# Patient Record
Sex: Female | Born: 1965 | Hispanic: No | Marital: Single | Smoking: Never smoker
Health system: Southern US, Community
[De-identification: ages and names within clinical notes are randomized; demographics above are authoritative.]

## PROBLEM LIST (undated history)

## (undated) DIAGNOSIS — F329 Major depressive disorder, single episode, unspecified: Secondary | ICD-10-CM

## (undated) DIAGNOSIS — M549 Dorsalgia, unspecified: Secondary | ICD-10-CM

## (undated) DIAGNOSIS — Z973 Presence of spectacles and contact lenses: Secondary | ICD-10-CM

## (undated) DIAGNOSIS — J45909 Unspecified asthma, uncomplicated: Secondary | ICD-10-CM

## (undated) DIAGNOSIS — Z9889 Other specified postprocedural states: Secondary | ICD-10-CM

## (undated) DIAGNOSIS — T4145XA Adverse effect of unspecified anesthetic, initial encounter: Secondary | ICD-10-CM

## (undated) DIAGNOSIS — F32A Depression, unspecified: Secondary | ICD-10-CM

## (undated) DIAGNOSIS — Z8614 Personal history of Methicillin resistant Staphylococcus aureus infection: Secondary | ICD-10-CM

## (undated) DIAGNOSIS — T8859XA Other complications of anesthesia, initial encounter: Secondary | ICD-10-CM

## (undated) DIAGNOSIS — F419 Anxiety disorder, unspecified: Secondary | ICD-10-CM

## (undated) DIAGNOSIS — R51 Headache: Secondary | ICD-10-CM

## (undated) DIAGNOSIS — E7800 Pure hypercholesterolemia, unspecified: Secondary | ICD-10-CM

## (undated) DIAGNOSIS — Z8489 Family history of other specified conditions: Secondary | ICD-10-CM

## (undated) DIAGNOSIS — J302 Other seasonal allergic rhinitis: Secondary | ICD-10-CM

## (undated) DIAGNOSIS — R519 Headache, unspecified: Secondary | ICD-10-CM

## (undated) DIAGNOSIS — Z8709 Personal history of other diseases of the respiratory system: Secondary | ICD-10-CM

## (undated) DIAGNOSIS — R112 Nausea with vomiting, unspecified: Secondary | ICD-10-CM

## (undated) DIAGNOSIS — N393 Stress incontinence (female) (male): Secondary | ICD-10-CM

## (undated) HISTORY — PX: OOPHORECTOMY: SHX86

## (undated) HISTORY — PX: TUBAL LIGATION: SHX77

## (undated) HISTORY — PX: COLONOSCOPY: SHX174

## (undated) HISTORY — PX: ESOPHAGOGASTRODUODENOSCOPY: SHX1529

## (undated) HISTORY — PX: BREAST SURGERY: SHX581

---

## 1990-09-26 HISTORY — PX: HERNIA REPAIR: SHX51

## 1991-09-27 HISTORY — PX: DIAGNOSTIC LAPAROSCOPY: SUR761

## 1999-09-27 HISTORY — PX: ABDOMINAL HYSTERECTOMY: SHX81

## 2003-09-27 HISTORY — PX: CHOLECYSTECTOMY: SHX55

## 2013-08-08 ENCOUNTER — Encounter (HOSPITAL_BASED_OUTPATIENT_CLINIC_OR_DEPARTMENT_OTHER): Payer: Self-pay | Admitting: *Deleted

## 2013-08-08 NOTE — Progress Notes (Signed)
No labs needed

## 2013-08-12 ENCOUNTER — Encounter (HOSPITAL_BASED_OUTPATIENT_CLINIC_OR_DEPARTMENT_OTHER): Payer: Self-pay | Admitting: Certified Registered Nurse Anesthetist

## 2013-08-13 ENCOUNTER — Ambulatory Visit (HOSPITAL_BASED_OUTPATIENT_CLINIC_OR_DEPARTMENT_OTHER)
Admission: RE | Admit: 2013-08-13 | Payer: Managed Care, Other (non HMO) | Source: Ambulatory Visit | Admitting: Plastic Surgery

## 2013-08-13 HISTORY — DX: Unspecified asthma, uncomplicated: J45.909

## 2013-08-13 HISTORY — DX: Other seasonal allergic rhinitis: J30.2

## 2013-08-13 HISTORY — DX: Depression, unspecified: F32.A

## 2013-08-13 HISTORY — DX: Presence of spectacles and contact lenses: Z97.3

## 2013-08-13 HISTORY — DX: Major depressive disorder, single episode, unspecified: F32.9

## 2013-08-13 HISTORY — DX: Other complications of anesthesia, initial encounter: T88.59XA

## 2013-08-13 HISTORY — DX: Dorsalgia, unspecified: M54.9

## 2013-08-13 HISTORY — DX: Adverse effect of unspecified anesthetic, initial encounter: T41.45XA

## 2013-08-13 SURGERY — MAMMOPLASTY, REDUCTION
Anesthesia: General | Site: Breast | Laterality: Bilateral

## 2013-08-13 MED ORDER — FENTANYL CITRATE 0.05 MG/ML IJ SOLN
INTRAMUSCULAR | Status: AC
  Filled 2013-08-13: qty 12

## 2013-08-13 MED ORDER — PROPOFOL 10 MG/ML IV EMUL
INTRAVENOUS | Status: AC
  Filled 2013-08-13: qty 50

## 2013-08-13 MED ORDER — MIDAZOLAM HCL 2 MG/2ML IJ SOLN
INTRAMUSCULAR | Status: AC
  Filled 2013-08-13: qty 2

## 2015-04-20 ENCOUNTER — Encounter (HOSPITAL_BASED_OUTPATIENT_CLINIC_OR_DEPARTMENT_OTHER): Payer: Self-pay | Admitting: Emergency Medicine

## 2015-04-20 DIAGNOSIS — J45909 Unspecified asthma, uncomplicated: Secondary | ICD-10-CM | POA: Diagnosis not present

## 2015-04-20 DIAGNOSIS — R112 Nausea with vomiting, unspecified: Secondary | ICD-10-CM | POA: Insufficient documentation

## 2015-04-20 NOTE — ED Notes (Signed)
Pt states that she has vomited for past week, was placed on antiobitc a week ago, saw pcp today to have abscess lanced, stated she informed pcp about nausea and vomiting, but was told to take medication with food, which she states has not helped, vomited twice tonight.

## 2015-04-21 ENCOUNTER — Emergency Department (HOSPITAL_BASED_OUTPATIENT_CLINIC_OR_DEPARTMENT_OTHER)
Admission: EM | Admit: 2015-04-21 | Discharge: 2015-04-21 | Discharge status: Against Medical Advice | Attending: Emergency Medicine | Admitting: Emergency Medicine

## 2015-08-17 ENCOUNTER — Other Ambulatory Visit: Payer: Self-pay | Admitting: Orthopedic Surgery

## 2015-08-17 DIAGNOSIS — M533 Sacrococcygeal disorders, not elsewhere classified: Secondary | ICD-10-CM

## 2015-09-02 ENCOUNTER — Ambulatory Visit
Admission: RE | Admit: 2015-09-02 | Discharge: 2015-09-02 | Disposition: A | Discharge status: Home / Self Care | Source: Ambulatory Visit | Attending: Orthopedic Surgery | Admitting: Orthopedic Surgery

## 2015-09-02 DIAGNOSIS — M533 Sacrococcygeal disorders, not elsewhere classified: Secondary | ICD-10-CM

## 2015-12-22 NOTE — H&P (Signed)
PREOPERATIVE H&P  Chief Complaint: R low back pain  HPI: Gabrielle Thompson is a 50 y.o. female who presents with ongoing pain in the R low back  Patient did obtain temporary relief from a R SI injection and medial brach blocks, but an RFA was denied and patient continued to have pain  Patient has failed multiple forms of conservative care and continues to have pain (see office notes for additional details regarding the patient's full course of treatment)  Past Medical History  Diagnosis Date  . Asthma   . Seasonal allergies   . Back pain   . Depression   . Wears glasses   . Complication of anesthesia     sensitive to meds-does not take much   Past Surgical History  Procedure Laterality Date  . Cholecystectomy  2005    lap choli  . Abdominal hysterectomy  2001    part-has ovaries  . Diagnostic laparoscopy  1993    lysis adhesions  . Hernia repair  1992    rt ing hernia  . Tubal ligation     Social History   Social History  . Marital Status: Single    Spouse Name: N/A  . Number of Children: N/A  . Years of Education: N/A   Social History Main Topics  . Smoking status: Never Smoker   . Smokeless tobacco: Not on file  . Alcohol Use: Yes     Comment: occ  . Drug Use: No  . Sexual Activity: Not on file   Other Topics Concern  . Not on file   Social History Narrative   No family history on file. Allergies  Allergen Reactions  . Latex Anaphylaxis  . Adhesive [Tape] Other (See Comments)    Blisters skin, Please use "paper" tape  . Clindamycin/Lincomycin Nausea Only    Got c-diff  . Codeine Nausea And Vomiting  . Erythromycin Nausea Only    Gi upset  . Penicillins Nausea And Vomiting    Has patient had a PCN reaction causing immediate rash, facial/tongue/throat swelling, SOB or lightheadedness with hypotension: Yes, severe projectile Has patient had a PCN reaction causing severe rash involving mucus membranes or skin necrosis: No Has patient had a PCN  reaction that required hospitalization No Has patient had a PCN reaction occurring within the last 10 years: No If all of the above answers are "NO", then may proceed with Cephalosporin use.    Prior to Admission medications   Medication Sig Start Date End Date Taking? Authorizing Provider  acetaminophen (TYLENOL) 500 MG tablet Take 1,000 mg by mouth 2 (two) times daily as needed.   Yes Historical Provider, MD  albuterol (PROVENTIL HFA;VENTOLIN HFA) 108 (90 BASE) MCG/ACT inhaler Inhale into the lungs every 6 (six) hours as needed for wheezing or shortness of breath.   Yes Historical Provider, MD  albuterol-ipratropium (COMBIVENT) 18-103 MCG/ACT inhaler Inhale into the lungs every 4 (four) hours as needed for wheezing or shortness of breath.   Yes Historical Provider, MD  BELSOMRA 10 MG TABS Take 10 mg by mouth at bedtime as needed. 11/16/15  Yes Historical Provider, MD  cholecalciferol (VITAMIN D) 1000 UNITS tablet Take 4,000 Units by mouth every evening. 2000   Yes Historical Provider, MD  CRESTOR 5 MG tablet Take 5 mg by mouth at bedtime. 09/18/15  Yes Historical Provider, MD  diphenhydrAMINE (BENADRYL) 25 mg capsule Take 25 mg by mouth at bedtime as needed.   Yes Historical Provider, MD  Fluticasone-Salmeterol (ADVAIR  DISKUS) 250-50 MCG/DOSE AEPB Inhale 1 puff into the lungs 2 (two) times daily as needed. 12/01/14  Yes Historical Provider, MD  loratadine (CLARITIN) 10 MG tablet Take 10 mg by mouth every evening.   Yes Historical Provider, MD  LYRICA 50 MG capsule Take 50 mg by mouth 3 (three) times daily as needed. 11/16/15  Yes Historical Provider, MD  ondansetron (ZOFRAN) 4 MG tablet Take 4 mg by mouth every 8 (eight) hours as needed for nausea or vomiting.   Yes Historical Provider, MD  RA KRILL OIL 500 MG CAPS Take 1,000 mg by mouth daily.   Yes Historical Provider, MD  sertraline (ZOLOFT) 50 MG tablet Take 50 mg by mouth every morning. Reported on 12/21/2015   Yes Historical Provider, MD    traMADol (ULTRAM) 50 MG tablet Take 100 mg by mouth 3 (three) times daily as needed for moderate pain.  12/10/15  Yes Historical Provider, MD     All other systems have been reviewed and were otherwise negative with the exception of those mentioned in the HPI and as above.  Physical Exam: There were no vitals filed for this visit.  General: Alert, no acute distress Cardiovascular: No pedal edema Respiratory: No cyanosis, no use of accessory musculature Skin: No lesions in the area of chief complaint Neurologic: Sensation intact distally Psychiatric: Patient is competent for consent with normal mood and affect Lymphatic: No axillary or cervical lymphadenopathy  MUSCULOSKELETAL: + TTP R low back  Assessment/Plan: Right sided sacroiliac joint dysfunction  Plan for Procedure(s): RIGHT SIDED SACROILIAC JOINT FUSION   Emilee HeroUMONSKI,Brieanna Nau LEONARD, MD 12/22/2015 12:44 PM

## 2015-12-23 ENCOUNTER — Ambulatory Visit (HOSPITAL_COMMUNITY)
Admission: RE | Admit: 2015-12-23 | Discharge: 2015-12-23 | Disposition: A | Discharge status: Home / Self Care | Source: Ambulatory Visit | Attending: Orthopedic Surgery | Admitting: Orthopedic Surgery

## 2015-12-23 ENCOUNTER — Encounter (HOSPITAL_COMMUNITY)
Admission: RE | Admit: 2015-12-23 | Discharge: 2015-12-23 | Disposition: A | Discharge status: Home / Self Care | Source: Ambulatory Visit | Attending: Orthopedic Surgery | Admitting: Orthopedic Surgery

## 2015-12-23 ENCOUNTER — Encounter (HOSPITAL_COMMUNITY): Payer: Self-pay

## 2015-12-23 DIAGNOSIS — Z01818 Encounter for other preprocedural examination: Secondary | ICD-10-CM | POA: Diagnosis not present

## 2015-12-23 DIAGNOSIS — R9431 Abnormal electrocardiogram [ECG] [EKG]: Secondary | ICD-10-CM | POA: Insufficient documentation

## 2015-12-23 DIAGNOSIS — I517 Cardiomegaly: Secondary | ICD-10-CM | POA: Insufficient documentation

## 2015-12-23 DIAGNOSIS — Z01812 Encounter for preprocedural laboratory examination: Secondary | ICD-10-CM | POA: Insufficient documentation

## 2015-12-23 DIAGNOSIS — E7800 Pure hypercholesterolemia, unspecified: Secondary | ICD-10-CM | POA: Insufficient documentation

## 2015-12-23 DIAGNOSIS — Z01810 Encounter for preprocedural cardiovascular examination: Secondary | ICD-10-CM | POA: Insufficient documentation

## 2015-12-23 HISTORY — DX: Stress incontinence (female) (male): N39.3

## 2015-12-23 HISTORY — DX: Pure hypercholesterolemia, unspecified: E78.00

## 2015-12-23 HISTORY — DX: Other specified postprocedural states: R11.2

## 2015-12-23 HISTORY — DX: Family history of other specified conditions: Z84.89

## 2015-12-23 HISTORY — DX: Personal history of other diseases of the respiratory system: Z87.09

## 2015-12-23 HISTORY — DX: Other specified postprocedural states: Z98.890

## 2015-12-23 HISTORY — DX: Personal history of Methicillin resistant Staphylococcus aureus infection: Z86.14

## 2015-12-23 HISTORY — DX: Headache: R51

## 2015-12-23 HISTORY — DX: Headache, unspecified: R51.9

## 2015-12-23 HISTORY — DX: Anxiety disorder, unspecified: F41.9

## 2015-12-23 LAB — PROTIME-INR
INR: 0.96 (ref 0.00–1.49)
PROTHROMBIN TIME: 13 s (ref 11.6–15.2)

## 2015-12-23 LAB — COMPREHENSIVE METABOLIC PANEL
ALT: 56 U/L — AB (ref 14–54)
AST: 47 U/L — AB (ref 15–41)
Albumin: 4.4 g/dL (ref 3.5–5.0)
Alkaline Phosphatase: 78 U/L (ref 38–126)
Anion gap: 9 (ref 5–15)
BILIRUBIN TOTAL: 0.6 mg/dL (ref 0.3–1.2)
BUN: 11 mg/dL (ref 6–20)
CO2: 26 mmol/L (ref 22–32)
CREATININE: 1.07 mg/dL — AB (ref 0.44–1.00)
Calcium: 9.7 mg/dL (ref 8.9–10.3)
Chloride: 107 mmol/L (ref 101–111)
GFR calc Af Amer: >60 mL/min (ref >60)
Glucose, Bld: 97 mg/dL (ref 65–99)
Potassium: 4.6 mmol/L (ref 3.5–5.1)
Sodium: 142 mmol/L (ref 135–145)
TOTAL PROTEIN: 7.8 g/dL (ref 6.5–8.1)

## 2015-12-23 LAB — URINE MICROSCOPIC-ADD ON

## 2015-12-23 LAB — URINALYSIS, ROUTINE W REFLEX MICROSCOPIC
BILIRUBIN URINE: NEGATIVE
GLUCOSE, UA: NEGATIVE mg/dL
KETONES UR: NEGATIVE mg/dL
Leukocytes, UA: NEGATIVE
Nitrite: NEGATIVE
PH: 6 (ref 5.0–8.0)
Protein, ur: NEGATIVE mg/dL
Specific Gravity, Urine: 1.021 (ref 1.005–1.030)

## 2015-12-23 LAB — CBC WITH DIFFERENTIAL/PLATELET
BASOS ABS: 0 10*3/uL (ref 0.0–0.1)
Basophils Relative: 0 %
Eosinophils Absolute: 0.1 10*3/uL (ref 0.0–0.7)
Eosinophils Relative: 2 %
HEMATOCRIT: 43.9 % (ref 36.0–46.0)
Hemoglobin: 14.7 g/dL (ref 12.0–15.0)
LYMPHS ABS: 1.2 10*3/uL (ref 0.7–4.0)
LYMPHS PCT: 22 %
MCH: 30.2 pg (ref 26.0–34.0)
MCHC: 33.5 g/dL (ref 30.0–36.0)
MCV: 90.1 fL (ref 78.0–100.0)
MONO ABS: 0.5 10*3/uL (ref 0.1–1.0)
Monocytes Relative: 9 %
NEUTROS ABS: 3.9 10*3/uL (ref 1.7–7.7)
Neutrophils Relative %: 67 %
Platelets: 272 10*3/uL (ref 150–400)
RBC: 4.87 MIL/uL (ref 3.87–5.11)
RDW: 12.8 % (ref 11.5–15.5)
WBC: 5.8 10*3/uL (ref 4.0–10.5)

## 2015-12-23 LAB — SURGICAL PCR SCREEN
MRSA, PCR: NEGATIVE
Staphylococcus aureus: NEGATIVE

## 2015-12-23 LAB — TYPE AND SCREEN
ABO/RH(D): A POS
Antibody Screen: NEGATIVE

## 2015-12-23 LAB — ABO/RH: ABO/RH(D): A POS

## 2015-12-23 LAB — APTT: APTT: 27 s (ref 24–37)

## 2015-12-23 MED ORDER — VANCOMYCIN HCL IN DEXTROSE 1-5 GM/200ML-% IV SOLN
1000.0000 mg | INTRAVENOUS | Status: AC
  Administered 2015-12-24: 1000 mg via INTRAVENOUS
  Filled 2015-12-23: qty 200

## 2015-12-23 NOTE — Progress Notes (Signed)
PCP - Dr. Everrett Coombeody Matthews Cardiologist - denies  EKG - 12/23/15 CXR - 12/23/15  Echo/stress test/cardiac cath - denies  Patient denies chest pain and shortness of breath at PAT appointment.

## 2015-12-23 NOTE — Pre-Procedure Instructions (Signed)
    Nichele Kohlmeyer  12/23/2015      [facility] [phone] - [place], [place] - [address] [facility] [address] [place] [id number] Phone: [phone] Fax: [phone]    Your procedure is scheduled on Thursday, March 30th, 2017.  [facility] 7:00 [facility].M.  Call this number if you have problems the morning of surgery:  [phone]   Remember:  Do not eat food or drink liquids after midnight.   Take these medicines the morning of surgery with A SIP OF WATER: Acetaminophen (Tylenol) if needed, Albuterol inhaler if needed (please bring with you), Albuterol-ipratropium (Combivent) inhaler if needed, Advair Diskus if needed, Lyrica if needed, Ondansetron (Zofran) if needed, Sertraline (Zoloft), Tramadol (Ultram) if needed.   Stop taking: Aspirin, NSAIDS, Aleve, Naproxen, Ibuprofen, Advil, Motrin, BC's, Goody's, Fish oil, all herbal medications, and all vitamins.    Do not wear jewelry, make-up or nail polish.  Do not wear lotions, powders, or perfumes.  You may NOT wear deodorant.  Do not shave 48 hours prior to surgery.   Do not bring valuables to the hospital.   [facility] is not responsible for any belongings or valuables.  Contacts, dentures or bridgework may not be worn into surgery.  Leave your suitcase in the car.  After surgery it may be brought to your room.  For patients admitted to the hospital, discharge time will be determined by your treatment team.  Patients discharged the day of surgery will not be allowed to drive home.   Special instructions:  See attached.   Please read over the following fact sheets that you were given. Pain Booklet, Coughing and Deep Breathing, Blood Transfusion Information, MRSA Information and Surgical Site Infection Prevention

## 2015-12-23 NOTE — Progress Notes (Signed)
Anesthesia Chart Review: Patient is a 50 year old female scheduled for right sided SI joint fusion on 12/24/15 by Dr. Yevette Edwardsumonski.  History includes non-smoker, post-operative N/V, asthma, back pain, depression, hypercholesterolemia, anxiety, migraines, MRSA, cholecystectomy, hysterectomy, right inguinal hernia repair. BMI 29. PCP is Dr. Everrett Coombeody Matthews.  Meds include albuterol, Belsomra, Combivent, Crestor, Benadryl, Advair, Claritin, Lyrica, Zofran, Krill oil, Zoloft, tramadol.    12/23/15 EKG: NSR, possible anterior infarct (age undetermined). No comparison tracing seen in Epic, Care Everywhere, or Muse. She denied CP, SOB, and prior cardiac testing at PAT.  If no acute changes then I anticipate that she can proceed as planned.  Velna Ochsllison Kiearra Oyervides, PA-C [facility]/Anesthesiology Phone [phone] 12/23/2015 2:21 PM

## 2015-12-24 ENCOUNTER — Encounter (HOSPITAL_COMMUNITY)
Admission: RE | Disposition: A | Discharge status: Home / Self Care | Payer: Self-pay | Source: Ambulatory Visit | Attending: Orthopedic Surgery

## 2015-12-24 ENCOUNTER — Ambulatory Visit (HOSPITAL_COMMUNITY)

## 2015-12-24 ENCOUNTER — Encounter (HOSPITAL_COMMUNITY): Payer: Self-pay | Admitting: *Deleted

## 2015-12-24 ENCOUNTER — Ambulatory Visit (HOSPITAL_COMMUNITY)
Admission: RE | Admit: 2015-12-24 | Discharge: 2015-12-24 | Disposition: A | Discharge status: Home / Self Care | Source: Ambulatory Visit | Attending: Orthopedic Surgery | Admitting: Orthopedic Surgery

## 2015-12-24 ENCOUNTER — Ambulatory Visit (HOSPITAL_COMMUNITY): Admitting: Anesthesiology

## 2015-12-24 DIAGNOSIS — M533 Sacrococcygeal disorders, not elsewhere classified: Secondary | ICD-10-CM | POA: Diagnosis not present

## 2015-12-24 DIAGNOSIS — Z419 Encounter for procedure for purposes other than remedying health state, unspecified: Secondary | ICD-10-CM

## 2015-12-24 DIAGNOSIS — J45909 Unspecified asthma, uncomplicated: Secondary | ICD-10-CM | POA: Insufficient documentation

## 2015-12-24 HISTORY — PX: SACROILIAC JOINT FUSION: SHX6088

## 2015-12-24 SURGERY — SACROILIAC JOINT FUSION
Anesthesia: General | Laterality: Right

## 2015-12-24 MED ORDER — SCOPOLAMINE 1 MG/3DAYS TD PT72
MEDICATED_PATCH | TRANSDERMAL | Status: AC
  Filled 2015-12-24: qty 1

## 2015-12-24 MED ORDER — 0.9 % SODIUM CHLORIDE (POUR BTL) OPTIME
TOPICAL | Status: DC | PRN
  Administered 2015-12-24: 1000 mL

## 2015-12-24 MED ORDER — OXYCODONE HCL 5 MG PO TABS
5.0000 mg | ORAL_TABLET | Freq: Once | ORAL | Status: AC | PRN
  Administered 2015-12-24: 5 mg via ORAL

## 2015-12-24 MED ORDER — OXYCODONE HCL 5 MG PO TABS
ORAL_TABLET | ORAL | Status: AC
  Filled 2015-12-24: qty 1

## 2015-12-24 MED ORDER — MIDAZOLAM HCL 2 MG/2ML IJ SOLN
INTRAMUSCULAR | Status: AC
  Filled 2015-12-24: qty 2

## 2015-12-24 MED ORDER — HYDROMORPHONE HCL 1 MG/ML IJ SOLN
INTRAMUSCULAR | Status: AC
  Administered 2015-12-24: 0.5 mg via INTRAVENOUS
  Filled 2015-12-24: qty 1

## 2015-12-24 MED ORDER — GLYCOPYRROLATE 0.2 MG/ML IJ SOLN
INTRAMUSCULAR | Status: DC | PRN
  Administered 2015-12-24: 0.4 mg via INTRAVENOUS

## 2015-12-24 MED ORDER — BUPIVACAINE-EPINEPHRINE 0.25% -1:200000 IJ SOLN
INTRAMUSCULAR | Status: DC | PRN
  Administered 2015-12-24: 30 mL

## 2015-12-24 MED ORDER — ROCURONIUM BROMIDE 50 MG/5ML IV SOLN
INTRAVENOUS | Status: AC
  Filled 2015-12-24: qty 1

## 2015-12-24 MED ORDER — PROPOFOL 10 MG/ML IV BOLUS
INTRAVENOUS | Status: AC
  Filled 2015-12-24: qty 20

## 2015-12-24 MED ORDER — FENTANYL CITRATE (PF) 100 MCG/2ML IJ SOLN
INTRAMUSCULAR | Status: DC | PRN
  Administered 2015-12-24: 50 ug via INTRAVENOUS
  Administered 2015-12-24: 150 ug via INTRAVENOUS
  Administered 2015-12-24: 50 ug via INTRAVENOUS

## 2015-12-24 MED ORDER — MIDAZOLAM HCL 5 MG/5ML IJ SOLN
INTRAMUSCULAR | Status: DC | PRN
  Administered 2015-12-24: 2 mg via INTRAVENOUS

## 2015-12-24 MED ORDER — ONDANSETRON HCL 4 MG/2ML IJ SOLN
INTRAMUSCULAR | Status: AC
  Filled 2015-12-24: qty 2

## 2015-12-24 MED ORDER — HYDROMORPHONE HCL 1 MG/ML IJ SOLN
0.2500 mg | INTRAMUSCULAR | Status: DC | PRN
  Administered 2015-12-24 (×4): 0.5 mg via INTRAVENOUS

## 2015-12-24 MED ORDER — CYCLOBENZAPRINE HCL 5 MG PO TABS: 5.0000 mg | ORAL_TABLET | Freq: Three times a day (TID) | ORAL | Status: AC | PRN

## 2015-12-24 MED ORDER — OXYCODONE HCL 5 MG/5ML PO SOLN: 5.0000 mg | Freq: Once | ORAL | Status: AC | PRN

## 2015-12-24 MED ORDER — PROPOFOL 10 MG/ML IV BOLUS
INTRAVENOUS | Status: DC | PRN
  Administered 2015-12-24: 150 mg via INTRAVENOUS

## 2015-12-24 MED ORDER — NEOSTIGMINE METHYLSULFATE 10 MG/10ML IV SOLN
INTRAVENOUS | Status: DC | PRN
  Administered 2015-12-24: 2 mg via INTRAVENOUS

## 2015-12-24 MED ORDER — LIDOCAINE HCL (CARDIAC) 20 MG/ML IV SOLN
INTRAVENOUS | Status: DC | PRN
  Administered 2015-12-24: 40 mg via INTRAVENOUS

## 2015-12-24 MED ORDER — POVIDONE-IODINE 7.5 % EX SOLN: Freq: Once | CUTANEOUS | Status: DC

## 2015-12-24 MED ORDER — SCOPOLAMINE 1 MG/3DAYS TD PT72
1.0000 | MEDICATED_PATCH | TRANSDERMAL | Status: DC
  Administered 2015-12-24: 1.5 mg via TRANSDERMAL
  Filled 2015-12-24: qty 1

## 2015-12-24 MED ORDER — FENTANYL CITRATE (PF) 250 MCG/5ML IJ SOLN
INTRAMUSCULAR | Status: AC
  Filled 2015-12-24: qty 5

## 2015-12-24 MED ORDER — LACTATED RINGERS IV SOLN
INTRAVENOUS | Status: DC
  Administered 2015-12-24 (×2): via INTRAVENOUS

## 2015-12-24 MED ORDER — ARTIFICIAL TEARS OP OINT
TOPICAL_OINTMENT | OPHTHALMIC | Status: DC | PRN
  Administered 2015-12-24: 1 via OPHTHALMIC

## 2015-12-24 MED ORDER — BUPIVACAINE HCL (PF) 0.25 % IJ SOLN
INTRAMUSCULAR | Status: AC
  Filled 2015-12-24: qty 30

## 2015-12-24 MED ORDER — ONDANSETRON HCL 4 MG/2ML IJ SOLN
INTRAMUSCULAR | Status: DC | PRN
  Administered 2015-12-24: 4 mg via INTRAVENOUS

## 2015-12-24 MED ORDER — DEXAMETHASONE SODIUM PHOSPHATE 10 MG/ML IJ SOLN
INTRAMUSCULAR | Status: AC
  Filled 2015-12-24: qty 1

## 2015-12-24 MED ORDER — LIDOCAINE HCL (CARDIAC) 20 MG/ML IV SOLN
INTRAVENOUS | Status: AC
  Filled 2015-12-24: qty 5

## 2015-12-24 MED ORDER — GLYCOPYRROLATE 0.2 MG/ML IJ SOLN
INTRAMUSCULAR | Status: AC
  Filled 2015-12-24: qty 4

## 2015-12-24 MED ORDER — BUPIVACAINE-EPINEPHRINE (PF) 0.25% -1:200000 IJ SOLN
INTRAMUSCULAR | Status: AC
  Filled 2015-12-24: qty 30

## 2015-12-24 MED ORDER — PHENYLEPHRINE HCL 10 MG/ML IJ SOLN
INTRAMUSCULAR | Status: DC | PRN
  Administered 2015-12-24: 40 ug via INTRAVENOUS
  Administered 2015-12-24: 80 ug via INTRAVENOUS

## 2015-12-24 MED ORDER — DEXAMETHASONE SODIUM PHOSPHATE 10 MG/ML IJ SOLN
INTRAMUSCULAR | Status: DC | PRN
  Administered 2015-12-24: 10 mg via INTRAVENOUS

## 2015-12-24 MED ORDER — ROCURONIUM BROMIDE 100 MG/10ML IV SOLN
INTRAVENOUS | Status: DC | PRN
  Administered 2015-12-24: 40 mg via INTRAVENOUS

## 2015-12-24 MED ORDER — ONDANSETRON HCL 4 MG/2ML IJ SOLN
4.0000 mg | Freq: Four times a day (QID) | INTRAMUSCULAR | Status: AC | PRN
  Administered 2015-12-24: 4 mg via INTRAVENOUS

## 2015-12-24 MED ORDER — DIAZEPAM 5 MG PO TABS
ORAL_TABLET | ORAL | Status: AC
  Administered 2015-12-24: 5 mg
  Filled 2015-12-24: qty 1

## 2015-12-24 SURGICAL SUPPLY — 55 items
BENZOIN TINCTURE PRP APPL 2/3 (GAUZE/BANDAGES/DRESSINGS) ×2 IMPLANT
BLADE SURG 10 STRL SS (BLADE) ×2 IMPLANT
BLADE SURG ROTATE 9660 (MISCELLANEOUS) ×2 IMPLANT
CANISTER SUCTION 2500CC (MISCELLANEOUS) IMPLANT
CAP-I-FUSE IMPLANT SYSTEM ×2 IMPLANT
CLSR STERI-STRIP ANTIMIC 1/2X4 (GAUZE/BANDAGES/DRESSINGS) ×2 IMPLANT
COVER SURGICAL LIGHT HANDLE (MISCELLANEOUS) ×2 IMPLANT
DRAPE C-ARM 42X72 X-RAY (DRAPES) ×2 IMPLANT
DRAPE C-ARMOR (DRAPES) ×2 IMPLANT
DRAPE INCISE IOBAN 66X45 STRL (DRAPES) ×2 IMPLANT
DRAPE POUCH INSTRU U-SHP 10X18 (DRAPES) ×2 IMPLANT
DRAPE SURG 17X23 STRL (DRAPES) ×6 IMPLANT
DURAPREP 26ML APPLICATOR (WOUND CARE) ×2 IMPLANT
ELECT CAUTERY BLADE 6.4 (BLADE) ×2 IMPLANT
ELECT REM PT RETURN 9FT ADLT (ELECTROSURGICAL) ×2
ELECTRODE REM PT RTRN 9FT ADLT (ELECTROSURGICAL) ×1 IMPLANT
GAUZE SPONGE 4X4 12PLY STRL (GAUZE/BANDAGES/DRESSINGS) ×2 IMPLANT
GAUZE SPONGE 4X4 16PLY XRAY LF (GAUZE/BANDAGES/DRESSINGS) ×2 IMPLANT
GLOVE BIO SURGEON STRL SZ7 (GLOVE) ×2 IMPLANT
GLOVE BIO SURGEON STRL SZ8 (GLOVE) ×2 IMPLANT
GLOVE BIOGEL PI IND STRL 7.0 (GLOVE) ×1 IMPLANT
GLOVE BIOGEL PI IND STRL 8 (GLOVE) ×1 IMPLANT
GLOVE BIOGEL PI INDICATOR 7.0 (GLOVE) ×1
GLOVE BIOGEL PI INDICATOR 8 (GLOVE) ×1
GOWN STRL REUS W/ TWL LRG LVL3 (GOWN DISPOSABLE) ×4 IMPLANT
GOWN STRL REUS W/ TWL XL LVL3 (GOWN DISPOSABLE) ×1 IMPLANT
GOWN STRL REUS W/TWL LRG LVL3 (GOWN DISPOSABLE) ×4
GOWN STRL REUS W/TWL XL LVL3 (GOWN DISPOSABLE) ×1
KIT BASIN OR (CUSTOM PROCEDURE TRAY) ×2 IMPLANT
KIT ROOM TURNOVER OR (KITS) ×2 IMPLANT
MANIFOLD NEPTUNE II (INSTRUMENTS) ×2 IMPLANT
NEEDLE 22X1 1/2 (OR ONLY) (NEEDLE) ×2 IMPLANT
NEEDLE HYPO 25GX1X1/2 BEV (NEEDLE) ×2 IMPLANT
NS IRRIG 1000ML POUR BTL (IV SOLUTION) ×2 IMPLANT
PACK UNIVERSAL I (CUSTOM PROCEDURE TRAY) ×2 IMPLANT
PAD ARMBOARD 7.5X6 YLW CONV (MISCELLANEOUS) ×4 IMPLANT
PENCIL BUTTON HOLSTER BLD 10FT (ELECTRODE) ×2 IMPLANT
SPONGE GAUZE 4X4 12PLY STER LF (GAUZE/BANDAGES/DRESSINGS) ×2 IMPLANT
SPONGE LAP 18X18 X RAY DECT (DISPOSABLE) ×2 IMPLANT
STAPLER VISISTAT 35W (STAPLE) ×2 IMPLANT
STRIP CLOSURE SKIN 1/2X4 (GAUZE/BANDAGES/DRESSINGS) ×2 IMPLANT
SUT MNCRL AB 4-0 PS2 18 (SUTURE) ×2 IMPLANT
SUT VIC AB 0 CT1 18XCR BRD 8 (SUTURE) IMPLANT
SUT VIC AB 0 CT1 8-18 (SUTURE)
SUT VIC AB 1 CT1 18XCR BRD 8 (SUTURE) ×1 IMPLANT
SUT VIC AB 1 CT1 8-18 (SUTURE) ×1
SUT VIC AB 2-0 CT2 18 VCP726D (SUTURE) ×2 IMPLANT
SYR BULB IRRIGATION 50ML (SYRINGE) ×4 IMPLANT
SYR CONTROL 10ML LL (SYRINGE) ×2 IMPLANT
TAPE CLOTH SURG 4X10 WHT LF (GAUZE/BANDAGES/DRESSINGS) ×2 IMPLANT
TOWEL OR 17X24 6PK STRL BLUE (TOWEL DISPOSABLE) ×2 IMPLANT
TOWEL OR 17X26 10 PK STRL BLUE (TOWEL DISPOSABLE) ×4 IMPLANT
TUBE CONNECTING 12X1/4 (SUCTIONS) ×2 IMPLANT
WATER STERILE IRR 1000ML POUR (IV SOLUTION) IMPLANT
YANKAUER SUCT BULB TIP NO VENT (SUCTIONS) ×2 IMPLANT

## 2015-12-24 NOTE — Anesthesia Postprocedure Evaluation (Signed)
Anesthesia Post Note  Patient: Gabrielle Thompson  Procedure(s) Performed: Procedure(s) (LRB): RIGHT SIDED SACROILIAC JOINT FUSION (Right)  Patient location during evaluation: PACU Anesthesia Type: General Level of consciousness: awake and alert Pain management: pain level controlled Vital Signs Assessment: post-procedure vital signs reviewed and stable Respiratory status: spontaneous breathing, nonlabored ventilation, respiratory function stable and patient connected to nasal cannula oxygen Cardiovascular status: blood pressure returned to baseline and stable Postop Assessment: no signs of nausea or vomiting Anesthetic complications: no    Last Vitals:  Filed Vitals:   12/24/15 1300 12/24/15 1315  BP:    Pulse: 95 97  Temp:    Resp: 21 15    Last Pain:  Filed Vitals:   12/24/15 1317  PainSc: 8                  Tyrail Grandfield A.

## 2015-12-24 NOTE — Anesthesia Preprocedure Evaluation (Signed)
Anesthesia Evaluation  Patient identified by MRN, date of birth, ID band Patient awake    Reviewed: Allergy & Precautions, NPO status , Patient's Chart, lab work & pertinent test results  History of Anesthesia Complications (+) PONV  Airway Mallampati: II   Neck ROM: full    Dental   Pulmonary asthma ,    breath sounds clear to auscultation       Cardiovascular negative cardio ROS   Rhythm:regular Rate:Normal     Neuro/Psych  Headaches, Anxiety Depression    GI/Hepatic   Endo/Other    Renal/GU      Musculoskeletal   Abdominal   Peds  Hematology   Anesthesia Other Findings   Reproductive/Obstetrics                             Anesthesia Physical Anesthesia Plan  ASA: II  Anesthesia Plan: General   Post-op Pain Management:    Induction: Intravenous  Airway Management Planned: Oral ETT  Additional Equipment:   Intra-op Plan:   Post-operative Plan: Extubation in OR  Informed Consent: I have reviewed the patients History and Physical, chart, labs and discussed the procedure including the risks, benefits and alternatives for the proposed anesthesia with the patient or authorized representative who has indicated his/her understanding and acceptance.     Plan Discussed with: CRNA, Anesthesiologist and Surgeon  Anesthesia Plan Comments:         Anesthesia Quick Evaluation

## 2015-12-24 NOTE — Anesthesia Procedure Notes (Signed)
Procedure Name: Intubation Date/Time: 12/24/2015 11:00 AM Performed by: Renford DillsMULLINS, Shannon Kirkendall L Pre-anesthesia Checklist: Patient identified, Emergency Drugs available, Suction available and Patient being monitored Patient Re-evaluated:Patient Re-evaluated prior to inductionOxygen Delivery Method: Circle system utilized Preoxygenation: Pre-oxygenation with 100% oxygen Intubation Type: IV induction Ventilation: Mask ventilation without difficulty Laryngoscope Size: Miller and 2 Grade View: Grade I Tube type: Oral Tube size: 7.0 mm Number of attempts: 1 Airway Equipment and Method: Stylet Placement Confirmation: ETT inserted through vocal cords under direct vision,  positive ETCO2 and breath sounds checked- equal and bilateral Secured at: 21 cm Tube secured with: Tape Dental Injury: Teeth and Oropharynx as per pre-operative assessment

## 2015-12-24 NOTE — Transfer of Care (Signed)
Immediate Anesthesia Transfer of Care Note  Patient: Gabrielle Thompson  Procedure(s) Performed: Procedure(s) with comments: RIGHT SIDED SACROILIAC JOINT FUSION (Right) - RIGHT SIDED SACROILIAC JOINT FUSION  Patient Location: PACU  Anesthesia Type:General  Level of Consciousness: awake  Airway & Oxygen Therapy: Patient Spontanous Breathing and Patient connected to nasal cannula oxygen  Post-op Assessment: Report given to RN and Post -op Vital signs reviewed and stable  Post vital signs: stable  Last Vitals:  Filed Vitals:   12/24/15 0748  BP: 153/85  Pulse: 89  Temp: 36.6 C  Resp: 20    Complications: No apparent anesthesia complications

## 2015-12-25 ENCOUNTER — Encounter (HOSPITAL_COMMUNITY): Payer: Self-pay | Admitting: Orthopedic Surgery

## 2015-12-25 NOTE — Op Note (Signed)
[name], [name]            ACCOUNT NO.:  [id number]  MEDICAL RECORD NO.:  [id number]  LOCATION:                                 FACILITY:  PHYSICIAN:  Estill BambergMark Marquavis Hannen, MD      DATE OF BIRTH:  10/23/1965  DATE OF PROCEDURE:  12/24/2015                              OPERATIVE REPORT   PREOPERATIVE DIAGNOSIS:  Right-sided sacroiliac joint dysfunction.  POSTOPERATIVE DIAGNOSIS:  Right-sided sacroiliac joint dysfunction.  PROCEDURE:  Right-sided sacroiliac joint fusion using the iFuse SI fusion system.  SURGEON:  Estill BambergMark Gwendelyn Lanting, MD  ASSISTANT:  Jason CoopKayla McKenzie, PA-C  ANESTHESIA:  General endotracheal anesthesia.  COMPLICATIONS:  None.  DISPOSITION:  Stable.  ESTIMATED BLOOD LOSS:  Minimal.  INDICATIONS FOR SURGERY:  Briefly, Ms. [name] is very pleasant 50- year-old female, who did present to me with ongoing pain at the right side of her low back.  Her pain had been present for over 2 years.  We did proceed with multiple forms of appropriate conservative care, including medial branch blocks with the goal of proceeding with an RFA procedure.  She did get excellent improvement with her medial branch blocks temporarily, however, her insurance company would not approve proceeding with an RFA procedure.  Given her ongoing pain, we did discuss proceeding with an SI fusion as noted above.  The patient was fully aware of the risks and limitations of the procedure and did elect to proceed.  OPERATIVE DETAILS:  On December 24, 2015, patient was brought to surgery and general endotracheal anesthesia was administered.  The patient was placed prone on a well-padded flat Jackson bed with gel rolls placed onto her chest and hips.  Antibiotics were given and a time-out procedure was performed and the right buttock was prepped and draped in the usual fashion.  A small 3 cm incision was made overlying the posterior border of the sacrum.  I then advanced 3 guidewires across the sacroiliac  joint.  The 1st guidewire was just above the S1 foramen, the second was below the S1 foramen, and the third was in line with the S2 foramen.  I then drilled and broached over the guidewires and I did advance SI implants of the appropriate length across the guidewires.  Of note, the implants and guidewires never traversed the S1 or S2 foramina. I did liberally use lateral as well as in an outlet fluoroscopy in order to ensure appropriate positioning of the guidewires and implants.  I was very pleased with the final construct.  The guidewires were then removed.  The wound was then copiously irrigated with normal saline. The wound was then closed in layers using #1 Vicryl followed by 0 Vicryl, followed by 2-0 Vicryl, followed by 3-0 Monocryl.  Benzoin and Steri-Strips were applied followed by sterile dressing.  All instrument counts were correct at the termination of the procedure.  Of note, Jason CoopKayla McKenzie was my assistant throughout surgery, and did aid in retraction, suctioning, and closure from start to finish.     Estill BambergMark Grady Lucci, MD     MD/MEDQ  D:  12/24/2015  T:  12/24/2015  Job:  [id number]

## 2015-12-25 NOTE — OR Nursing (Signed)
Addendum created to reflect time out of Recovery

## 2016-08-12 IMAGING — RF DG C-ARM 61-120 MIN
1 series · 3 of 3 positions shown · non-contrast
Comparison: CT pelvis 09/02/2015

CLINICAL DATA: Patient status post right side SI joint fusion.

EXAM:
DG C-ARM 61-120 MIN; BILATERAL SACROILIAC JOINTS - 3+ VIEW

[Series 1: run · 3 of 3 slices shown]
[im 1/3]
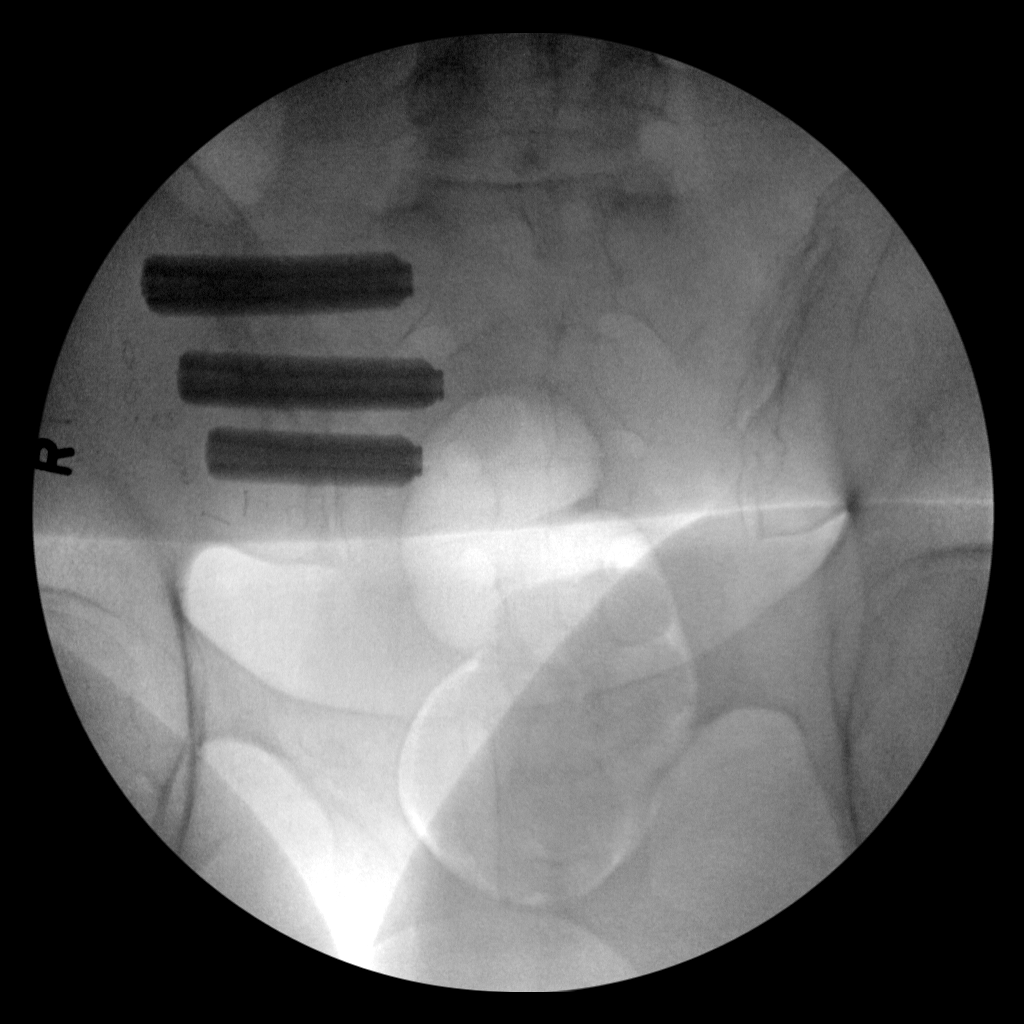
[im 2/3]
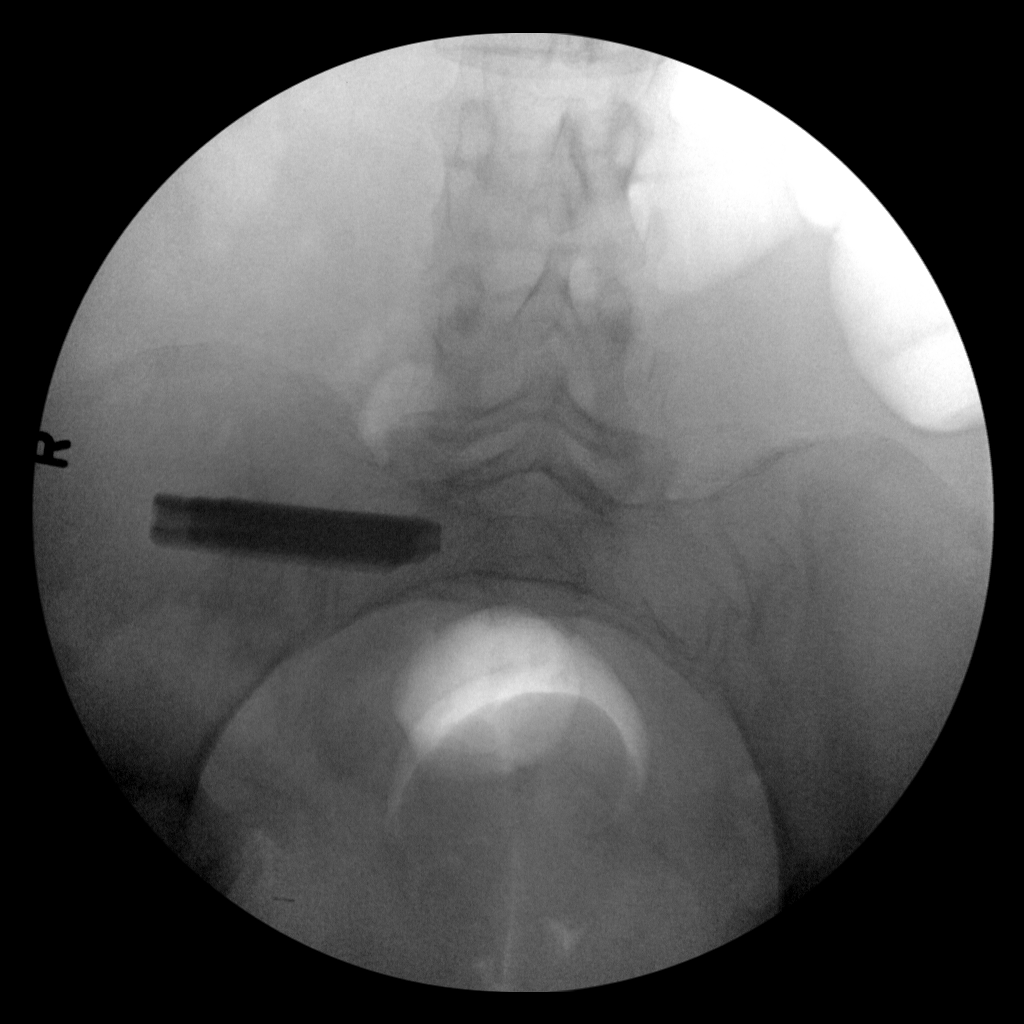
[im 3/3]
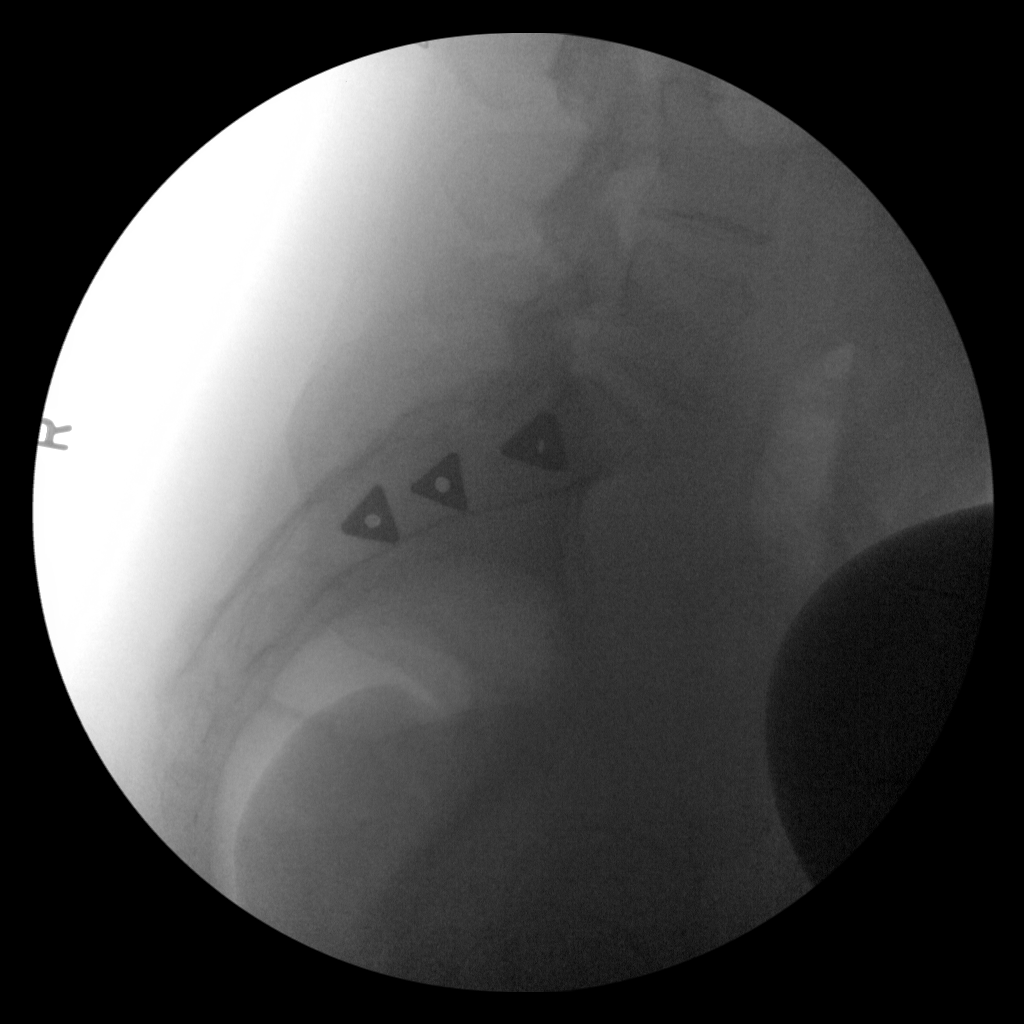

[3 of 3 positions shown; findings below may reference images not displayed]

FINDINGS: Three intraoperative fluoroscopic images were submitted
demonstrating postsurgical changes/fusion of the right SI joint. No
evidence for acute osseous abnormality.
IMPRESSION: Patient status post right SI joint fusion without acute osseous
abnormality.
